# Patient Record
Sex: Female | Born: 1995
Health system: Southern US, Community
[De-identification: ages and names within clinical notes are randomized; demographics above are authoritative.]

## PROBLEM LIST (undated history)

## (undated) DIAGNOSIS — K219 Gastro-esophageal reflux disease without esophagitis: Secondary | ICD-10-CM

## (undated) DIAGNOSIS — M419 Scoliosis, unspecified: Secondary | ICD-10-CM

## (undated) HISTORY — DX: Gastro-esophageal reflux disease without esophagitis: K21.9

## (undated) HISTORY — PX: MANDIBLE SURGERY: SHX707

## (undated) HISTORY — DX: Scoliosis, unspecified: M41.9

---
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
## 2017-01-06 ENCOUNTER — Telehealth: Payer: Self-pay | Admitting: Internal Medicine

## 2017-01-06 NOTE — Telephone Encounter (Signed)
Pts mother is calling stating that she is in need of documentation stating that the pt was Dx w/ADHD and when/who Dx pt.  This is for college purpose due to her swimming and they do random drug testing and she wants to have documentation so that the pt will be legal for taking the Rx VYVANSE.  Pts mother would like to have a call back.

## 2017-01-07 ENCOUNTER — Encounter: Payer: Self-pay | Admitting: Emergency Medicine

## 2017-01-07 ENCOUNTER — Telehealth: Payer: Self-pay | Admitting: Emergency Medicine

## 2017-01-07 NOTE — Telephone Encounter (Signed)
Pts mother is calling back with the fax # [phone] [facility] [employer] [facility])

## 2017-01-07 NOTE — Telephone Encounter (Signed)
Would you like for me to write a letter? Please advise.

## 2017-01-07 NOTE — Telephone Encounter (Signed)
Letter has been faxed to the number and ATTN that was provided

## 2017-01-07 NOTE — Telephone Encounter (Signed)
Spoke with patient mother regarding letter being ready for pick up. Patient mother states that patient would like the letter faxed and will call back with the fax number.

## 2017-01-07 NOTE — Telephone Encounter (Signed)
Patient's mother arrived in clinic and would like to update the fax information.  The fax number is still [phone] but would like it to be ATTN: Nicholes Rough.

## 2017-01-07 NOTE — Telephone Encounter (Signed)
Left a VM for patient to give the office a call back.  

## 2017-01-07 NOTE — Telephone Encounter (Signed)
Ok to send letter that says   Dr  Lurline Hare psychologist  evaluated patient and advised trial of medication as she met the criteria for ADD  . As her PCP  In November 2016  We made a formal dx of ADD and began her on a Trial medication .   She is currently taking vyvanse 30 mg per day that has been helpful for her.     FYI I dont see dr Georgina Snell report in the EHR   If needed perhaps mom has that or if needed have to get from him . thanks

## 2017-02-06 ENCOUNTER — Encounter: Payer: Self-pay | Admitting: Internal Medicine

## 2017-03-06 DIAGNOSIS — R05 Cough: Secondary | ICD-10-CM | POA: Diagnosis not present

## 2017-03-06 DIAGNOSIS — J189 Pneumonia, unspecified organism: Secondary | ICD-10-CM | POA: Diagnosis not present

## 2017-03-06 DIAGNOSIS — R509 Fever, unspecified: Secondary | ICD-10-CM | POA: Diagnosis not present

## 2017-04-08 DIAGNOSIS — Z01419 Encounter for gynecological examination (general) (routine) without abnormal findings: Secondary | ICD-10-CM | POA: Diagnosis not present

## 2017-04-08 DIAGNOSIS — Z6822 Body mass index (BMI) 22.0-22.9, adult: Secondary | ICD-10-CM | POA: Diagnosis not present

## 2017-04-17 DIAGNOSIS — M25511 Pain in right shoulder: Secondary | ICD-10-CM | POA: Diagnosis not present

## 2017-04-28 DIAGNOSIS — J019 Acute sinusitis, unspecified: Secondary | ICD-10-CM | POA: Diagnosis not present

## 2017-04-28 DIAGNOSIS — J029 Acute pharyngitis, unspecified: Secondary | ICD-10-CM | POA: Diagnosis not present

## 2017-04-28 DIAGNOSIS — R05 Cough: Secondary | ICD-10-CM | POA: Diagnosis not present

## 2017-05-04 ENCOUNTER — Encounter: Payer: Self-pay | Admitting: Family Medicine

## 2017-05-04 ENCOUNTER — Ambulatory Visit (INDEPENDENT_AMBULATORY_CARE_PROVIDER_SITE_OTHER): Payer: BLUE CROSS/BLUE SHIELD | Admitting: Family Medicine

## 2017-05-04 VITALS — BP 108/62 | HR 90 | Temp 98.5°F | Ht 68.02 in | Wt 149.3 lb

## 2017-05-04 DIAGNOSIS — J988 Other specified respiratory disorders: Secondary | ICD-10-CM

## 2017-05-04 NOTE — Patient Instructions (Signed)

## 2017-05-04 NOTE — Progress Notes (Signed)
HPI:  Acute visit for respiratory illness: -started:5-6 days ago -symptoms:nasal congestion, sore throat, pnd, mild cough -denies:fever, SOB, NVD, tooth pain, body aches -[facility], strep negative, given cefidinir for "sinuses"  -sick contacts/travel/risks: no reported flu, strep or tick exposure -Hx of: sinus infection, pneumonia  ROS: See pertinent positives and negatives per HPI.  Past Medical History:  Diagnosis Date  . GERD (gastroesophageal reflux disease)    infant  . Premature baby    35 weeks   . Scoliosis     Past Surgical History:  Procedure Laterality Date  . MANDIBLE SURGERY      History reviewed. No pertinent family history.  Social History   Socioeconomic History  . Marital status: Single    Spouse name: None  . Number of children: None  . Years of education: None  . Highest education level: None  Social Needs  . Financial resource strain: None  . Food insecurity - worry: None  . Food insecurity - inability: None  . Transportation needs - medical: None  . Transportation needs - non-medical: None  Occupational History  . None  Tobacco Use  . Smoking status: Never Smoker  . Smokeless tobacco: Never Used  Substance and Sexual Activity  . Alcohol use: No    Alcohol/week: 0.0 oz  . Drug use: No  . Sexual activity: None  Other Topics Concern  . None  Social History Narrative   No illness in family   Neg ets   HH of  5    2 dog s   8th grade   [employer].    Swimming.  [employer]   [employer]     Current Outpatient Medications:  Marland Kitchen.  GILDESS FE 1/20 1-20 MG-MCG tablet, , Disp: , Rfl:  .  lisdexamfetamine (VYVANSE) 30 MG capsule, Take 1 capsule (30 mg total) by mouth daily. Fill  Third last ., Disp: 30 capsule, Rfl: 0  EXAM:  Vitals:   05/04/17 1257  BP: 108/62  Pulse: 90  Temp: 98.5 F (36.9 C)  SpO2: 98%    Body mass index is 22.69 kg/m.  GENERAL: vitals reviewed and listed above, alert, oriented,  appears well hydrated and in no acute distress  HEENT: atraumatic, conjunttiva clear, no obvious abnormalities on inspection of external nose and ears, normal appearance of ear canals and TMs, clear nasal congestion, mild post oropharyngeal erythema with PND, no tonsillar edema or exudate, no sinus TTP  NECK: no obvious masses on inspection  LUNGS: clear to auscultation bilaterally, no wheezes, rales or rhonchi, good air movement  CV: HRRR, no peripheral edema  MS: moves all extremities without noticeable abnormality  PSYCH: pleasant and cooperative, no obvious depression or anxiety  ASSESSMENT AND PLAN:  Discussed the following assessment and plan:  Respiratory infection  -given HPI and exam findings today, a serious infection or illness is unlikely. We discussed potential etiologies, with VURI being most likely, and advised supportive care and monitoring. We discussed treatment side effects, likely course, antibiotic misuse, transmission, and signs of developing a serious illness.  -of course, we advised to return or notify a doctor immediately if symptoms worsen or new concerns arise. Also advised follow up if not improving over the next 5-7 days.    Patient Instructions  INSTRUCTIONS FOR UPPER RESPIRATORY INFECTION:  -plenty of rest and fluids  -nasal saline wash 2-3 times daily (use prepackaged nasal saline or bottled/distilled water if making your own)   -can use AFRIN  nasal spray for drainage and nasal congestion - but do NOT use longer then 3-4 days  -can use tylenol (in no history of liver disease) or ibuprofen (if no history of kidney disease, bowel bleeding or significant heart disease) as directed for aches and sorethroat  -in the winter time, using a humidifier at night is helpful (please follow cleaning instructions)  -if you are taking a cough medication - use only as directed, may also try a teaspoon of honey to coat the throat and throat lozenges. If given a  cough medication with codeine or hydrocodone or other narcotic please be advised that this contains a strong and  potentially addicting medication. Please follow instructions carefully, take as little as possible and only use AS NEEDED for severe cough. Discuss potential side effects with your pharmacy. Please do not drive or operate machinery while taking these types of medications. Please do not take other sedating medications, drugs or alcohol while taking this medication without discussing with your doctor.  -for sore throat, salt water gargles can help  -follow up if you have fevers, facial pain, tooth pain, difficulty breathing or are worsening or symptoms persist longer then expected  Upper Respiratory Infection, Adult An upper respiratory infection (URI) is also known as the common cold. It is often caused by a type of germ (virus). Colds are easily spread (contagious). You can pass it to others by kissing, coughing, sneezing, or drinking out of the same glass. Usually, you get better in 1 to 3  weeks.  However, the cough can last for even longer. HOME CARE   Only take medicine as told by your doctor. Follow instructions provided above.  Drink enough water and fluids to keep your pee (urine) clear or pale yellow.  Get plenty of rest.  Return to work when your temperature is < 100 for 24 hours or as told by your doctor. You may use a face mask and wash your hands to stop your cold from spreading. GET HELP RIGHT AWAY IF:   After the first few days, you feel you are getting worse.  You have questions about your medicine.  You have chills, shortness of breath, or red spit (mucus).  You have pain in the face for more then 1-2 days, especially when you bend forward.  You have a fever, puffy (swollen) neck, pain when you swallow, or white spots in the back of your throat.  You have a bad headache, ear pain, sinus pain, or chest pain.  You have a high-pitched whistling sound when you  breathe in and out (wheezing).  You cough up blood.  You have sore muscles or a stiff neck. MAKE SURE YOU:   Understand these instructions.  Will watch your condition.  Will get help right away if you are not doing well or get worse. Document Released: 10/22/2007 Document Revised: 07/28/2011 Document Reviewed: 08/10/2013 [facility] Patient Information 2015 [employer] [place] [place]. This information is not intended to replace advice given to you by your health care provider. Make sure you discuss any questions you have with your health care provider.    Kriste BasqueKIM, Bianney Rockwood R., DO

## 2017-05-21 DIAGNOSIS — M13811 Other specified arthritis, right shoulder: Secondary | ICD-10-CM | POA: Diagnosis not present

## 2017-05-21 DIAGNOSIS — M7541 Impingement syndrome of right shoulder: Secondary | ICD-10-CM | POA: Diagnosis not present

## 2017-06-03 DIAGNOSIS — J039 Acute tonsillitis, unspecified: Secondary | ICD-10-CM | POA: Diagnosis not present

## 2017-06-03 DIAGNOSIS — R5381 Other malaise: Secondary | ICD-10-CM | POA: Diagnosis not present

## 2017-07-17 DIAGNOSIS — M19011 Primary osteoarthritis, right shoulder: Secondary | ICD-10-CM | POA: Diagnosis not present

## 2017-07-17 DIAGNOSIS — G8918 Other acute postprocedural pain: Secondary | ICD-10-CM | POA: Diagnosis not present

## 2017-07-17 DIAGNOSIS — R600 Localized edema: Secondary | ICD-10-CM | POA: Diagnosis not present

## 2017-07-17 DIAGNOSIS — M7541 Impingement syndrome of right shoulder: Secondary | ICD-10-CM | POA: Diagnosis not present

## 2017-07-17 DIAGNOSIS — Z4889 Encounter for other specified surgical aftercare: Secondary | ICD-10-CM | POA: Diagnosis not present

## 2017-07-24 DIAGNOSIS — Z4789 Encounter for other orthopedic aftercare: Secondary | ICD-10-CM | POA: Diagnosis not present

## 2017-08-13 ENCOUNTER — Other Ambulatory Visit: Payer: Self-pay | Admitting: Internal Medicine

## 2017-08-13 NOTE — Telephone Encounter (Signed)
After filling out all information pts mother changed mind on which pharmacy she wants this sent to,  She is calling back with different pharmacy location

## 2017-08-13 NOTE — Telephone Encounter (Signed)
[facility] [place] Ph # [phone]. Please call once sent, [phone]

## 2017-08-13 NOTE — Telephone Encounter (Signed)
Copied from CRM [phone]. Topic: Quick Communication - Rx Refill/Question >> Aug 13, 2017 12:31 PM Eston Mouldavis, Doran Nestle B wrote: Medication: lisdexamfetamine (VYVANSE) 30 MG capsule  Has the patient contacted their pharmacy? Yes no refills  (Agent: If no, request that the patient contact the pharmacy for the refill.)  Preferred Pharmacy (with phone number or street name): [facility] [place] - [address] [phone] (Phone) [phone] (Fax)      Agent: Please be advised that RX refills may take up to 3 business days. We ask that you follow-up with your pharmacy.

## 2017-08-14 MED ORDER — LISDEXAMFETAMINE DIMESYLATE 30 MG PO CAPS: 30.0000 mg | ORAL_CAPSULE | Freq: Every day | ORAL | 0 refills | Status: DC

## 2017-08-14 NOTE — Telephone Encounter (Signed)
She is due for an ov med check with me  Last seem by me   July 18   But will send in refill  Today  Have her    Get on the appt schedule

## 2017-08-14 NOTE — Telephone Encounter (Signed)
Last filled 09/08/17, #30 Last OV 05/04/17  Please advise Dr Fabian SharpPanosh, thanks.

## 2017-08-17 NOTE — Telephone Encounter (Signed)
Pt notified via mychart Nothing further needed.  

## 2017-08-23 DIAGNOSIS — J029 Acute pharyngitis, unspecified: Secondary | ICD-10-CM | POA: Diagnosis not present

## 2017-08-23 DIAGNOSIS — J00 Acute nasopharyngitis [common cold]: Secondary | ICD-10-CM | POA: Diagnosis not present

## 2017-08-25 DIAGNOSIS — R05 Cough: Secondary | ICD-10-CM | POA: Diagnosis not present

## 2017-08-25 DIAGNOSIS — R0789 Other chest pain: Secondary | ICD-10-CM | POA: Diagnosis not present

## 2017-08-25 DIAGNOSIS — R0781 Pleurodynia: Secondary | ICD-10-CM | POA: Diagnosis not present

## 2017-08-25 DIAGNOSIS — R0602 Shortness of breath: Secondary | ICD-10-CM | POA: Diagnosis not present

## 2017-09-09 DIAGNOSIS — N390 Urinary tract infection, site not specified: Secondary | ICD-10-CM | POA: Diagnosis not present

## 2017-09-09 DIAGNOSIS — N898 Other specified noninflammatory disorders of vagina: Secondary | ICD-10-CM | POA: Diagnosis not present

## 2017-09-24 DIAGNOSIS — Z3009 Encounter for other general counseling and advice on contraception: Secondary | ICD-10-CM | POA: Diagnosis not present

## 2017-09-24 DIAGNOSIS — Z3202 Encounter for pregnancy test, result negative: Secondary | ICD-10-CM | POA: Diagnosis not present

## 2017-11-02 NOTE — Progress Notes (Signed)
Chief Complaint  Patient presents with  . Medication Management    HPI: Ann Cruz 22 y.o. come in for med  management  Last  seen  July 2018  By me   Last refill march 19   Not using med   Every single day.   Except exam time.  But helps a good bit  For focusing  Since last visit : Taking medication: better with work.  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  Sleep : 8  Diet : ok Mood : ok ( no se like with adderall) Medication assessment :No significant side effects such as major sleep issues and mood changes, chest pain, shortness of breath, headaches , GI or significant weight loss. Neg tad ex ocass etoh   transferring from [employer] to [employer]   Had injury this year with her swimming   Has hx of warts on fingers  And was rx with hot or something in past and helped but coming back small areas on each index finger .   no pain   ROS: See pertinent positives and negatives per HPI. No cp pulm sx noted   Past Medical History:  Diagnosis Date  . GERD (gastroesophageal reflux disease)    infant  . Premature baby    35 weeks   . Scoliosis     No family history on file.  Social History   Socioeconomic History  . Marital status: Single    Spouse name: Not on file  . Number of children: Not on file  . Years of education: Not on file  . Highest education level: Not on file  Occupational History  . Not on file  Social Needs  . Financial resource strain: Not on file  . Food insecurity:    Worry: Not on file    Inability: Not on file  . Transportation needs:    Medical: Not on file    Non-medical: Not on file  Tobacco Use  . Smoking status: Never Smoker  . Smokeless tobacco: Never Used  Substance and Sexual Activity  . Alcohol use: No    Alcohol/week: 0.0 oz  . Drug use: No  . Sexual activity: Not on file  Lifestyle  . Physical activity:    Days per week: Not on file    Minutes per  session: Not on file  . Stress: Not on file  Relationships  . Social connections:    Talks on phone: Not on file    Gets together: Not on file    Attends religious service: Not on file    Active member of club or organization: Not on file    Attends meetings of clubs or organizations: Not on file    Relationship status: Not on file  Other Topics Concern  . Not on file  Social History Narrative   No illness in family   Neg ets   HH of  5    2 dog s   8th grade   [employer].    Swimming.  [employer]   [employer]    Outpatient Medications Prior to Visit  Medication Sig Dispense Refill  . GILDESS FE 1/20 1-20 MG-MCG tablet Take 1 tablet by mouth daily.     Marland Kitchen. lisdexamfetamine (VYVANSE) 30 MG capsule Take 1 capsule (30 mg total) by mouth daily. Needs office visit before further refills (Patient taking differently: Take 30  mg by mouth daily. ) 30 capsule 0   No facility-administered medications prior to visit.      EXAM:  BP 125/73   Pulse 85   Temp 99.3 F (37.4 C)   Wt 151 lb (68.5 kg)   BMI 22.95 kg/m   Body mass index is 22.95 kg/m.  GENERAL: vitals reviewed and listed above, alert, oriented, appears well hydrated and in no acute distress HEENT: atraumatic, conjunctiva  clear, no obvious abnormalities on inspection of external nose and ears NECK: no obvious masses on inspection palpation  LUNGS: clear to auscultation bilaterally, no wheezes, rales or rhonchi, good air movement CV: HRRR, no clubbing cyanosis or  peripheral edema nl cap refill  Abdomen:  Sof,t normal bowel sounds without hepatosplenomegaly, no guarding rebound or masses no CVA tenderness MS: moves all extremities without noticeable focal  Abnormality Skin 1+ mm wart on left index finger and even smaller are on left      PSYCH: pleasant and cooperative, no obvious depression or anxiety Lab Results  Component Value Date   WBC 6.4 12/24/2016   HGB 15.1 12/24/2016   HCT 44.6 12/24/2016   PLT 286.0  12/24/2016   GLUCOSE 91 12/24/2016   CHOL 139 12/24/2016   TRIG 150.0 (H) 12/24/2016   HDL 55.40 12/24/2016   LDLCALC 53 12/24/2016   NA 136 12/24/2016   K 4.2 12/24/2016   CL 104 12/24/2016   CREATININE 0.97 12/24/2016   BUN 16 12/24/2016   CO2 28 12/24/2016   BP Readings from Last 3 Encounters:  11/03/17 125/73  05/04/17 108/62  11/25/16 104/70    ASSESSMENT AND PLAN:  Discussed the following assessment and plan:  Attention deficit hyperactivity disorder (ADHD), unspecified ADHD type  Medication management  Viral wart on finger - see text  Option to use  freez liq n but so small may wasn't ot use otc for a herw weeks  And if growing then  Re rx ( on a senistive area of   Distal finger pads  -Patient advised to return or notify health care team  if  new concerns arise.  Patient Instructions  Ok to remain on same medication    May want to try otc topical over night for  3-4 weeks and then as needed. If  Getting larger can come in for freezing rx if wishe.   PLan  ROV in 6 months .  Or as needed .     Neta Mends. Lily Kernen M.D.

## 2017-11-03 ENCOUNTER — Ambulatory Visit (INDEPENDENT_AMBULATORY_CARE_PROVIDER_SITE_OTHER): Payer: BLUE CROSS/BLUE SHIELD | Admitting: Internal Medicine

## 2017-11-03 ENCOUNTER — Encounter: Payer: Self-pay | Admitting: Internal Medicine

## 2017-11-03 VITALS — BP 125/73 | HR 85 | Temp 99.3°F | Wt 151.0 lb

## 2017-11-03 DIAGNOSIS — F909 Attention-deficit hyperactivity disorder, unspecified type: Secondary | ICD-10-CM

## 2017-11-03 DIAGNOSIS — B079 Viral wart, unspecified: Secondary | ICD-10-CM

## 2017-11-03 DIAGNOSIS — Z79899 Other long term (current) drug therapy: Secondary | ICD-10-CM

## 2017-11-03 MED ORDER — LISDEXAMFETAMINE DIMESYLATE 30 MG PO CAPS: 30.0000 mg | ORAL_CAPSULE | Freq: Every day | ORAL | 0 refills | Status: DC

## 2017-11-03 NOTE — Patient Instructions (Signed)
Ok to remain on same medication    May want to try otc topical over night for  3-4 weeks and then as needed. If  Getting larger can come in for freezing rx if wishe.   PLan  ROV in 6 months .  Or as needed .

## 2018-03-24 DIAGNOSIS — F909 Attention-deficit hyperactivity disorder, unspecified type: Secondary | ICD-10-CM | POA: Diagnosis not present

## 2018-03-31 DIAGNOSIS — F909 Attention-deficit hyperactivity disorder, unspecified type: Secondary | ICD-10-CM | POA: Diagnosis not present

## 2018-04-07 DIAGNOSIS — F909 Attention-deficit hyperactivity disorder, unspecified type: Secondary | ICD-10-CM | POA: Diagnosis not present

## 2018-04-21 DIAGNOSIS — F909 Attention-deficit hyperactivity disorder, unspecified type: Secondary | ICD-10-CM | POA: Diagnosis not present

## 2018-05-03 DIAGNOSIS — Z01419 Encounter for gynecological examination (general) (routine) without abnormal findings: Secondary | ICD-10-CM | POA: Diagnosis not present

## 2018-05-03 DIAGNOSIS — Z113 Encounter for screening for infections with a predominantly sexual mode of transmission: Secondary | ICD-10-CM | POA: Diagnosis not present

## 2018-05-03 DIAGNOSIS — N760 Acute vaginitis: Secondary | ICD-10-CM | POA: Diagnosis not present

## 2018-05-03 DIAGNOSIS — Z6822 Body mass index (BMI) 22.0-22.9, adult: Secondary | ICD-10-CM | POA: Diagnosis not present

## 2018-06-02 ENCOUNTER — Telehealth: Payer: Self-pay | Admitting: Internal Medicine

## 2018-06-02 NOTE — Telephone Encounter (Signed)
Patient has an appointment scheduled for 06/18/2018.

## 2018-06-02 NOTE — Telephone Encounter (Signed)
Medication not delegated for NT to refill. 

## 2018-06-02 NOTE — Telephone Encounter (Signed)
Copied from CRM [id number]. Topic: Quick Communication - Rx Refill/Question >> Jun 02, 2018  1:08 PM Mickel Baas B, [place] wrote: **Patient calling and made an appointment for medication refill. States that she only has 4 pills left. Her appointment is 06/18/2018. Please advise. **   Medication: lisdexamfetamine (VYVANSE) 30 MG capsule  Has the patient contacted their pharmacy? Yes.  To call office (Agent: If no, request that the patient contact the pharmacy for the refill.) (Agent: If yes, when and what did the pharmacy advise?)  Preferred Pharmacy (with phone number or street name[id number] [place], [place] - [address]  Agent: Please be advised that RX refills may take up to 3 business days. We ask that you follow-up with your pharmacy.

## 2018-06-02 NOTE — Telephone Encounter (Signed)
She is over due for ROV   Get her appt ( for when she is back in town and then I will refill medication.

## 2018-06-03 MED ORDER — LISDEXAMFETAMINE DIMESYLATE 30 MG PO CAPS: 30.0000 mg | ORAL_CAPSULE | Freq: Every day | ORAL | 0 refills | Status: DC

## 2018-06-03 NOTE — Addendum Note (Signed)
Addended byBerniece Andreas K on: 06/03/2018 12:14 PM   Modules accepted: Orders

## 2018-06-03 NOTE — Telephone Encounter (Signed)
Sent in electronically .  

## 2018-06-08 MED ORDER — LISDEXAMFETAMINE DIMESYLATE 30 MG PO CAPS: 30.0000 mg | ORAL_CAPSULE | Freq: Every day | ORAL | 0 refills | Status: DC

## 2018-06-08 NOTE — Telephone Encounter (Addendum)
Please cancel the  One sent ot [place]     Message to me didn't  Give the corect pharmacy   Sent in electronically . To updated pharmacy

## 2018-06-08 NOTE — Telephone Encounter (Signed)
Please advise Dr Panosh, thanks.   

## 2018-06-08 NOTE — Addendum Note (Signed)
Addended byMadelin Headings on: 06/08/2018 07:22 PM   Modules accepted: Orders

## 2018-06-08 NOTE — Telephone Encounter (Signed)
Pt states that she is in school at [employer] now and needs RX sent to correct pharmacy.  [id number] [place] Ho[place], [place] - [address] AT [address] [phone] (Phone) [phone] (Fax)   Please advise when sent in.

## 2018-06-09 NOTE — Telephone Encounter (Signed)
[facility]. Nothing further needed.

## 2018-06-16 DIAGNOSIS — Z20828 Contact with and (suspected) exposure to other viral communicable diseases: Secondary | ICD-10-CM | POA: Diagnosis not present

## 2018-06-16 DIAGNOSIS — R05 Cough: Secondary | ICD-10-CM | POA: Diagnosis not present

## 2018-06-17 NOTE — Progress Notes (Signed)
Chief Complaint  Patient presents with  . Follow-up    Pt is here for med refill of vyvanse     HPI: Ann Cruz 23 y.o. come in for med management  Taking most days and helps a lot  Trying to  Do intermal logs.    No significant side effects such as major sleep issues and mood changes, chest pain, shortness of breath, headaches , GI or significant weight loss. Had flu in past week .   Fever x 3   Took tamiflu little cough and just  congestion . ocass etoh .  Last semester 3.8 not sure yet.  upt today  No TRD      2 -3 time per week  Around 8 am .  ROS: See pertinent positives and negatives per HPI. No feer sob   Past Medical History:  Diagnosis Date  . GERD (gastroesophageal reflux disease)    infant  . Premature baby    35 weeks   . Scoliosis     No family history on file.  Social History   Socioeconomic History  . Marital status: Single    Spouse name: Not on file  . Number of children: Not on file  . Years of education: Not on file  . Highest education level: Not on file  Occupational History  . Not on file  Social Needs  . Financial resource strain: Not on file  . Food insecurity:    Worry: Not on file    Inability: Not on file  . Transportation needs:    Medical: Not on file    Non-medical: Not on file  Tobacco Use  . Smoking status: Never Smoker  . Smokeless tobacco: Never Used  Substance and Sexual Activity  . Alcohol use: No    Alcohol/week: 0.0 standard drinks  . Drug use: No  . Sexual activity: Not on file  Lifestyle  . Physical activity:    Days per week: Not on file    Minutes per session: Not on file  . Stress: Not on file  Relationships  . Social connections:    Talks on phone: Not on file    Gets together: Not on file    Attends religious service: Not on file    Active member of club or organization: Not on file    Attends meetings of clubs or organizations: Not on file    Relationship status: Not on file  Other Topics  Concern  . Not on file  Social History Narrative   No illness in family   Neg ets   HH of  5    2 dog s   8th grade   Agricultural engineer.    Swimming.  [employer]    Outpatient Medications Prior to Visit  Medication Sig Dispense Refill  . GILDESS FE 1/20 1-20 MG-MCG tablet Take 1 tablet by mouth daily.     Marland Kitchen oseltamivir (TAMIFLU) 75 MG capsule TK 1 C PO BID    . lisdexamfetamine (VYVANSE) 30 MG capsule Take 1 capsule (30 mg total) by mouth daily. 30 capsule 0   No facility-administered medications prior to visit.      EXAM:  BP 118/66 (BP Location: Right Arm, Patient Position: Sitting, Cuff Size: Normal)   Pulse 80   Temp 98.2 F (36.8 C) (Oral)   Ht 5\' 8"  (1.727 m)   Wt 150 lb 11.2 oz (68.4 kg)   LMP 05/24/2018   BMI 22.91 kg/m  Body mass index is 22.91 kg/m.  GENERAL: vitals reviewed and listed above, alert, oriented, appears well hydrated and in no acute distress congested but well HEENT: atraumatic, conjunctiva  clear, no obvious abnormalities on inspection of external nose and ears tms  Clear  OP : no lesion edema or exudate  NECK: no obvious masses on inspection palpation  LUNGS: clear to auscultation bilaterally, no wheezes, rales or rhonchi, good air movement CV: HRRR, no clubbing cyanosis or  peripheral edema nl cap refill  Abdomen:  Sof,t normal bowel sounds without hepatosplenomegaly, no guarding rebound or masses no CVA tenderness MS: moves all extremities without noticeable focal  abnormality PSYCH: pleasant and cooperative, no obvious depression or anxiety Lab Results  Component Value Date   WBC 6.4 12/24/2016   HGB 15.1 12/24/2016   HCT 44.6 12/24/2016   PLT 286.0 12/24/2016   GLUCOSE 91 12/24/2016   CHOL 139 12/24/2016   TRIG 150.0 (H) 12/24/2016   HDL 55.40 12/24/2016   LDLCALC 53 12/24/2016   NA 136 12/24/2016   K 4.2 12/24/2016   CL 104 12/24/2016   CREATININE 0.97 12/24/2016   BUN 16 12/24/2016   CO2 28 12/24/2016   BP Readings  from Last 3 Encounters:  06/18/18 118/66  11/03/17 125/73  05/04/17 108/62    ASSESSMENT AND PLAN:  Discussed the following assessment and plan:  Attention deficit hyperactivity disorder (ADHD), unspecified ADHD type  Medication management  Acute respiratory infection - recovering flu like under care  no alarm  findings  .exp managtment  Refill med today  -Patient advised to return or notify health care team  if  new concerns arise.  Patient Instructions  Medication  Refilled .  ROV  In 6 months   Med check or  CPX   Skyelar Halliday K. Nakira Litzau M.D.

## 2018-06-18 ENCOUNTER — Ambulatory Visit (INDEPENDENT_AMBULATORY_CARE_PROVIDER_SITE_OTHER): Payer: BLUE CROSS/BLUE SHIELD | Admitting: Internal Medicine

## 2018-06-18 ENCOUNTER — Encounter: Payer: Self-pay | Admitting: Internal Medicine

## 2018-06-18 VITALS — BP 118/66 | HR 80 | Temp 98.2°F | Ht 68.0 in | Wt 150.7 lb

## 2018-06-18 DIAGNOSIS — J22 Unspecified acute lower respiratory infection: Secondary | ICD-10-CM

## 2018-06-18 DIAGNOSIS — Z79899 Other long term (current) drug therapy: Secondary | ICD-10-CM | POA: Diagnosis not present

## 2018-06-18 DIAGNOSIS — F909 Attention-deficit hyperactivity disorder, unspecified type: Secondary | ICD-10-CM | POA: Diagnosis not present

## 2018-06-18 MED ORDER — LISDEXAMFETAMINE DIMESYLATE 30 MG PO CAPS: 30.0000 mg | ORAL_CAPSULE | Freq: Every day | ORAL | 0 refills | Status: DC

## 2018-06-18 NOTE — Patient Instructions (Signed)
Medication  Refilled .  ROV  In 6 months   Med check or  CPX

## 2018-08-04 DIAGNOSIS — B078 Other viral warts: Secondary | ICD-10-CM | POA: Diagnosis not present

## 2018-11-05 DIAGNOSIS — Z20828 Contact with and (suspected) exposure to other viral communicable diseases: Secondary | ICD-10-CM | POA: Diagnosis not present

## 2019-02-07 DIAGNOSIS — F429 Obsessive-compulsive disorder, unspecified: Secondary | ICD-10-CM | POA: Diagnosis not present

## 2019-02-07 DIAGNOSIS — F331 Major depressive disorder, recurrent, moderate: Secondary | ICD-10-CM | POA: Diagnosis not present

## 2019-02-15 DIAGNOSIS — F429 Obsessive-compulsive disorder, unspecified: Secondary | ICD-10-CM | POA: Diagnosis not present

## 2019-02-15 DIAGNOSIS — F411 Generalized anxiety disorder: Secondary | ICD-10-CM | POA: Diagnosis not present

## 2019-03-08 DIAGNOSIS — F411 Generalized anxiety disorder: Secondary | ICD-10-CM | POA: Diagnosis not present

## 2019-03-08 DIAGNOSIS — F429 Obsessive-compulsive disorder, unspecified: Secondary | ICD-10-CM | POA: Diagnosis not present

## 2019-03-13 DIAGNOSIS — N390 Urinary tract infection, site not specified: Secondary | ICD-10-CM | POA: Diagnosis not present

## 2019-03-17 DIAGNOSIS — F411 Generalized anxiety disorder: Secondary | ICD-10-CM | POA: Diagnosis not present

## 2019-03-28 DIAGNOSIS — F411 Generalized anxiety disorder: Secondary | ICD-10-CM | POA: Diagnosis not present

## 2019-03-28 DIAGNOSIS — F429 Obsessive-compulsive disorder, unspecified: Secondary | ICD-10-CM | POA: Diagnosis not present

## 2019-04-19 ENCOUNTER — Telehealth: Payer: BLUE CROSS/BLUE SHIELD | Admitting: Internal Medicine

## 2019-04-19 NOTE — Progress Notes (Deleted)
Virtual Visit via Video Note  I connected with@ on 04/19/19 at  3:30 PM EST by a video enabled telemedicine application and verified that I am speaking with the correct person using two identifiers. Location patient: home Location provider:work or home office Persons participating in the virtual visit: patient, provider  [place] national recommendations  regarding COVID 19 pandemic   video visit is advised over in office visit for this patient.  Patient aware  of the limitations of evaluation and management by telemedicine and  availability of in person appointments. and agreed to proceed.   HPI: Ann Cruz presents for video visit  Overdue for med check adhd   Last seen Jan 2020 No significant side effects such as major sleep issues and mood changes, chest pain, shortness of breath, headaches , GI or significant weight loss.  Hx of secondary anxiety stable     ROS: See pertinent positives and negatives per HPI.  Past Medical History:  Diagnosis Date  . GERD (gastroesophageal reflux disease)    infant  . Premature baby    35 weeks   . Scoliosis     Past Surgical History:  Procedure Laterality Date  . MANDIBLE SURGERY      No family history on file.  Social History   Tobacco Use  . Smoking status: Never Smoker  . Smokeless tobacco: Never Used  Substance Use Topics  . Alcohol use: No    Alcohol/week: 0.0 standard drinks  . Drug use: No      Current Outpatient Medications:  Marland Kitchen  GILDESS FE 1/20 1-20 MG-MCG tablet, Take 1 tablet by mouth daily. , Disp: , Rfl:  .  lisdexamfetamine (VYVANSE) 30 MG capsule, Take 1 capsule (30 mg total) by mouth daily., Disp: 30 capsule, Rfl: 0 .  oseltamivir (TAMIFLU) 75 MG capsule, TK 1 C PO BID, Disp: , Rfl:   EXAM: BP Readings from Last 3 Encounters:  06/18/18 118/66  11/03/17 125/73  05/04/17 108/62    VITALS per patient if applicable:  GENERAL: alert, oriented, appears well and in no acute distress  HEENT:  atraumatic, conjunttiva clear, no obvious abnormalities on inspection of external nose and ears  NECK: normal movements of the head and neck  LUNGS: on inspection no signs of respiratory distress, breathing rate appears normal, no obvious gross SOB, gasping or wheezing  CV: no obvious cyanosis  MS: moves all visible extremities without noticeable abnormality  PSYCH/NEURO: pleasant and cooperative, no obvious depression or anxiety, speech and thought processing grossly intact Lab Results  Component Value Date   WBC 6.4 12/24/2016   HGB 15.1 12/24/2016   HCT 44.6 12/24/2016   PLT 286.0 12/24/2016   GLUCOSE 91 12/24/2016   CHOL 139 12/24/2016   TRIG 150.0 (H) 12/24/2016   HDL 55.40 12/24/2016   LDLCALC 53 12/24/2016   NA 136 12/24/2016   K 4.2 12/24/2016   CL 104 12/24/2016   CREATININE 0.97 12/24/2016   BUN 16 12/24/2016   CO2 28 12/24/2016    ASSESSMENT AND PLAN:  Discussed the following assessment and plan:    ICD-10-CM   1. Attention deficit hyperactivity disorder (ADHD), unspecified ADHD type  F90.9   2. Medication management  Z79.899    ? Pap and flu inj Counseled.   Expectant management and discussion of plan and treatment with opportunity to ask questions and all were answered. The patient agreed with the plan and demonstrated an understanding of the instructions.   Advised to call back or seek  an in-person evaluation if worsening  or having  further concerns . No follow-ups on file. I provided *** minutes of non-face-to-face time during this encounter.   Berniece Andreas, MD

## 2019-05-05 DIAGNOSIS — Z01419 Encounter for gynecological examination (general) (routine) without abnormal findings: Secondary | ICD-10-CM | POA: Diagnosis not present

## 2019-05-05 DIAGNOSIS — N946 Dysmenorrhea, unspecified: Secondary | ICD-10-CM | POA: Diagnosis not present

## 2019-05-05 DIAGNOSIS — Z6823 Body mass index (BMI) 23.0-23.9, adult: Secondary | ICD-10-CM | POA: Diagnosis not present

## 2019-06-01 ENCOUNTER — Encounter: Payer: Self-pay | Admitting: Internal Medicine

## 2019-06-01 ENCOUNTER — Telehealth (INDEPENDENT_AMBULATORY_CARE_PROVIDER_SITE_OTHER): Payer: BLUE CROSS/BLUE SHIELD | Admitting: Internal Medicine

## 2019-06-01 ENCOUNTER — Other Ambulatory Visit: Payer: Self-pay

## 2019-06-01 DIAGNOSIS — Z79899 Other long term (current) drug therapy: Secondary | ICD-10-CM | POA: Diagnosis not present

## 2019-06-01 DIAGNOSIS — F909 Attention-deficit hyperactivity disorder, unspecified type: Secondary | ICD-10-CM

## 2019-06-01 MED ORDER — LISDEXAMFETAMINE DIMESYLATE 30 MG PO CAPS: 30.0000 mg | ORAL_CAPSULE | Freq: Every day | ORAL | 0 refills | Status: DC

## 2019-06-01 NOTE — Progress Notes (Signed)
Virtual Visit via Video Note  I connected with@ on 06/01/19 at  9:30 AM EST by a video enabled telemedicine application and verified that I am speaking with the correct person using two identifiers. Location patient: home Location provider:work office Persons participating in the virtual visit: patient, provider  [place] national recommendations  regarding COVID 19 pandemic   video visit is advised over in office visit for this patient.  Patient aware  of the limitations of evaluation and management by telemedicine and  availability of in person appointments. and agreed to proceed.   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 No major change in  heatlh status  Noted after going on line for studies  Some struggles getting organized and attentive and getting back on vyvanse able to organize and attend  Denies se of import . Not taking every day but mostly MOn - Fri.   Had GYNE check up  About 3 weeks ago and was normal  bp ok and  On ocps   Neg TD  Social etoh Sleep 8-9 hours  Exercise goes to gym and stays active.   ROS: See pertinent positives and negatives per HPI.  Past Medical History:  Diagnosis Date  . GERD (gastroesophageal reflux disease)    infant  . Premature baby    35 weeks   . Scoliosis     Past Surgical History:  Procedure Laterality Date  . MANDIBLE SURGERY      History reviewed. No pertinent family history.  Social History   Tobacco Use  . Smoking status: Never Smoker  . Smokeless tobacco: Never Used  Substance Use Topics  . Alcohol use: No    Alcohol/week: 0.0 standard drinks  . Drug use: No      Current Outpatient Medications:  Marland Kitchen  GILDESS FE 1/20 1-20 MG-MCG tablet, Take 1 tablet by mouth daily. , Disp: , Rfl:  .  lisdexamfetamine (VYVANSE) 30 MG capsule, Take 1  capsule (30 mg total) by mouth daily., Disp: 30 capsule, Rfl: 0  EXAM: BP Readings from Last 3 Encounters:  06/18/18 118/66  11/03/17 125/73  05/04/17 108/62    VITALS per patient if applicable:  GENERAL: alert, oriented, appears well and in no acute distress  HEENT: atraumatic, conjunttiva clear, no obvious abnormalities on inspection of external nose and ears  NECK: normal movements of the head and neck  LUNGS: on inspection no signs of respiratory distress, breathing rate appears normal, no obvious gross SOB, gasping or wheezing  CV: no obvious cyanosis  MS: moves all visible extremities without noticeable abnormality  PSYCH/NEURO: pleasant and cooperative, no obvious depression or anxiety, speech and thought processing grossly intact   ASSESSMENT AND PLAN:  Discussed the following assessment and plan:    ICD-10-CM   1. Attention deficit hyperactivity disorder (ADHD), unspecified ADHD type  F90.9   2. Medication management  Z79.899    Benefit more than risk of medication  to continue.   At this time   Counseled.   Ok to do 90 days if Physiological scientist .   Expectant management and discussion of plan and treatment with opportunity to ask questions and all were answered. The patient agreed with the plan and demonstrated an understanding of the  instructions.   Advised to call back or seek an in-person evaluation if worsening  or having  further concerns . Return in about 6 months (around 11/29/2019) for medication.    Shanon Ace, MD

## 2019-06-08 ENCOUNTER — Telehealth: Payer: Self-pay | Admitting: *Deleted

## 2019-06-08 NOTE — Telephone Encounter (Signed)
Copied from CRM [id number]. Topic: General - Inquiry >> Jun 08, 2019  9:42 AM Ann Cruz wrote: Reason for CRM: pt called and stated that the lisdexamfetamine (VYVANSE) 30 MG capsule [[id number]] is over 333.00 and can not pay for it.  She going to call [facility] and see which one they cover and call back  Best number [phone] >> Jun 08, 2019 10:24 AM Ann Cruz wrote: Pt stated that her insurance company recommended Dexedrine/ please advise

## 2019-06-08 NOTE — Telephone Encounter (Signed)
This has already been sent In a mychart message to Dr.Panosh

## 2019-06-09 MED ORDER — METHYLPHENIDATE HCL ER (OSM) 27 MG PO TBCR: 27.0000 mg | EXTENDED_RELEASE_TABLET | ORAL | 0 refills | Status: DC

## 2019-06-09 NOTE — Telephone Encounter (Signed)
I sent in concerta 27 to [facility] [address]. Let meknow how doing in a few weeks

## 2019-06-09 NOTE — Telephone Encounter (Signed)
So  Make sure you  Have checked or coupons on line from manufacturers   Wonder if you have a high decutable or not a formulary alternatove .  To hav such a high price   Sounds like not covered at all and usually a 200 $    Dexedrine   Not sure  If avaialabe in  ER form and would not recommend in  Immediate release form .     Consdier  A med like Focalin XR  ( methylphenidate derivative   ) or a ritalin based med such as  Generic concerta  metadate  Etc   Ask your pharmacist how much focalin XR 10 mg generic  Or concerta  27 mg  Generic  As available  Cost with your plan .   Let me know which you may want to try.

## 2019-06-17 DIAGNOSIS — N3090 Cystitis, unspecified without hematuria: Secondary | ICD-10-CM | POA: Diagnosis not present

## 2019-12-17 DIAGNOSIS — Z20822 Contact with and (suspected) exposure to covid-19: Secondary | ICD-10-CM | POA: Diagnosis not present

## 2019-12-18 DIAGNOSIS — J029 Acute pharyngitis, unspecified: Secondary | ICD-10-CM | POA: Diagnosis not present

## 2019-12-18 DIAGNOSIS — B349 Viral infection, unspecified: Secondary | ICD-10-CM | POA: Diagnosis not present

## 2020-01-02 ENCOUNTER — Telehealth: Payer: Self-pay | Admitting: Internal Medicine

## 2020-01-02 MED ORDER — METHYLPHENIDATE HCL ER (OSM) 27 MG PO TBCR: 27.0000 mg | EXTENDED_RELEASE_TABLET | ORAL | 0 refills | Status: DC

## 2020-01-02 NOTE — Telephone Encounter (Signed)
Needs to arrange  Visit med check  Can be virtual this time  Will refill x 1 this time  No further refill until   Visit  She will need in person visit for  Continued   Controlled substance  Management

## 2020-01-02 NOTE — Telephone Encounter (Signed)
Pt requesting a refill  methylphenidate (CONCERTA) 27 MG PO CR tablet  [id number] - [place], [place] - [address]

## 2020-01-02 NOTE — Telephone Encounter (Signed)
Last OV 06/01/2019  Last filled 06/09/2019, # 30 with 0 refills

## 2020-01-03 NOTE — Telephone Encounter (Signed)
Called patient and made a virtual appointment for her on 01/04/2020 at 12:30pm and will call her after 12pm to check her in. Patient verbalized an understanding.

## 2020-01-04 ENCOUNTER — Other Ambulatory Visit: Payer: Self-pay

## 2020-01-04 ENCOUNTER — Encounter: Payer: Self-pay | Admitting: Internal Medicine

## 2020-01-04 ENCOUNTER — Telehealth (INDEPENDENT_AMBULATORY_CARE_PROVIDER_SITE_OTHER): Payer: BLUE CROSS/BLUE SHIELD | Admitting: Internal Medicine

## 2020-01-04 VITALS — Ht 68.0 in | Wt 150.0 lb

## 2020-01-04 DIAGNOSIS — Z79899 Other long term (current) drug therapy: Secondary | ICD-10-CM

## 2020-01-04 DIAGNOSIS — Z20828 Contact with and (suspected) exposure to other viral communicable diseases: Secondary | ICD-10-CM | POA: Diagnosis not present

## 2020-01-04 DIAGNOSIS — F909 Attention-deficit hyperactivity disorder, unspecified type: Secondary | ICD-10-CM

## 2020-01-04 MED ORDER — METHYLPHENIDATE HCL ER (OSM) 27 MG PO TBCR: 27.0000 mg | EXTENDED_RELEASE_TABLET | ORAL | 0 refills | Status: AC

## 2020-01-04 NOTE — Progress Notes (Signed)
   Virtual Visit via Telephone Note  I connected with@ on 01/04/20 at 12:30 PM EDT by telephone and verified that I am speaking with the correct person using two identifiers.   I discussed the limitations, risks, security and privacy concerns of performing an evaluation and management service by telephone and the limited availability of in person appointments. tThere may be a patient responsible charge related to this service. The patient expressed understanding and agreed to proceed.  Location patient: homeschool Location provider: work office Participants present for the call: patient, provider Patient did not have a visit in the prior 7 days to address this/these issue(s).   History of Present Illness: Ann Cruz   Presents for [employer] visit   For med check  Taking now concerta 27  About 3 +days per week  Classes and on sun helps her get throught the  4 classes and no interference with sleep . Seems ok to help Changed from vyvanse cause of cost .  Senior  [employer]   neg td ocass weekend etoh ocass vape  Stopping  No rd  Sleep ok 8-9 hours  Mood ok  Is utd on pap smear was done dec 2020 On ocps ok  Iwas exposed and is on covid   isol qu for now  May be getting the vaccine ( mom had the disease and we discussed )  Observations/Objective: Patient sounds personable and well on the phone. I do not appreciate any SOB. Speech and thought processing are grossly intact. Patient reported vitals: Lab Results  Component Value Date   WBC 6.4 12/24/2016   HGB 15.1 12/24/2016   HCT 44.6 12/24/2016   PLT 286.0 12/24/2016   GLUCOSE 91 12/24/2016   CHOL 139 12/24/2016   TRIG 150.0 (H) 12/24/2016   HDL 55.40 12/24/2016   LDLCALC 53 12/24/2016   NA 136 12/24/2016   K 4.2 12/24/2016   CL 104 12/24/2016   CREATININE 0.97 12/24/2016   BUN 16 12/24/2016   CO2 28 12/24/2016    Assessment and Plan:   Follow Up Instructions: Plan  In person cpx preferred  No later than 6 mos    Benefit more than risk of medications  to continue. No sig se of med at this time    [id number] 5-10 [id number] 11-20 [id number] 21-30 I did not refer this patient for an OV in the next 24 hours for this/these issue(s).  I discussed the assessment and treatment plan with the patient. The patient was provided an opportunity to ask questions and answered. The patient agreed with the plan and demonstrated an understanding of the instructions.   The patient was advised to call back or seek an in-person evaluation if the symptoms worsen or if the condition fails to improve as anticipated.  I provided 11  minutes of non-face-to-face time during this encounter. Return for before 6 months  cpx and or med check .  Berniece Andreas, MD

## 2020-01-10 DIAGNOSIS — F429 Obsessive-compulsive disorder, unspecified: Secondary | ICD-10-CM | POA: Diagnosis not present

## 2020-01-10 DIAGNOSIS — F411 Generalized anxiety disorder: Secondary | ICD-10-CM | POA: Diagnosis not present

## 2020-01-31 DIAGNOSIS — F411 Generalized anxiety disorder: Secondary | ICD-10-CM | POA: Diagnosis not present

## 2020-01-31 DIAGNOSIS — F429 Obsessive-compulsive disorder, unspecified: Secondary | ICD-10-CM | POA: Diagnosis not present

## 2020-02-14 DIAGNOSIS — F411 Generalized anxiety disorder: Secondary | ICD-10-CM | POA: Diagnosis not present

## 2020-02-14 DIAGNOSIS — F429 Obsessive-compulsive disorder, unspecified: Secondary | ICD-10-CM | POA: Diagnosis not present

## 2020-02-28 DIAGNOSIS — F411 Generalized anxiety disorder: Secondary | ICD-10-CM | POA: Diagnosis not present

## 2020-02-28 DIAGNOSIS — F429 Obsessive-compulsive disorder, unspecified: Secondary | ICD-10-CM | POA: Diagnosis not present

## 2020-03-13 DIAGNOSIS — F411 Generalized anxiety disorder: Secondary | ICD-10-CM | POA: Diagnosis not present

## 2020-03-13 DIAGNOSIS — F429 Obsessive-compulsive disorder, unspecified: Secondary | ICD-10-CM | POA: Diagnosis not present

## 2020-04-17 DIAGNOSIS — F411 Generalized anxiety disorder: Secondary | ICD-10-CM | POA: Diagnosis not present

## 2020-04-17 DIAGNOSIS — F429 Obsessive-compulsive disorder, unspecified: Secondary | ICD-10-CM | POA: Diagnosis not present

## 2020-05-03 DIAGNOSIS — F429 Obsessive-compulsive disorder, unspecified: Secondary | ICD-10-CM | POA: Diagnosis not present

## 2020-05-03 DIAGNOSIS — F411 Generalized anxiety disorder: Secondary | ICD-10-CM | POA: Diagnosis not present

## 2020-05-21 DIAGNOSIS — Z6822 Body mass index (BMI) 22.0-22.9, adult: Secondary | ICD-10-CM | POA: Diagnosis not present

## 2020-05-21 DIAGNOSIS — Z01419 Encounter for gynecological examination (general) (routine) without abnormal findings: Secondary | ICD-10-CM | POA: Diagnosis not present

## 2020-05-21 DIAGNOSIS — Z113 Encounter for screening for infections with a predominantly sexual mode of transmission: Secondary | ICD-10-CM | POA: Diagnosis not present

## 2020-05-31 DIAGNOSIS — F429 Obsessive-compulsive disorder, unspecified: Secondary | ICD-10-CM | POA: Diagnosis not present

## 2020-05-31 DIAGNOSIS — F411 Generalized anxiety disorder: Secondary | ICD-10-CM | POA: Diagnosis not present

## 2020-06-18 DIAGNOSIS — F411 Generalized anxiety disorder: Secondary | ICD-10-CM | POA: Diagnosis not present

## 2020-06-18 DIAGNOSIS — F429 Obsessive-compulsive disorder, unspecified: Secondary | ICD-10-CM | POA: Diagnosis not present

## 2020-07-03 DIAGNOSIS — F429 Obsessive-compulsive disorder, unspecified: Secondary | ICD-10-CM | POA: Diagnosis not present

## 2020-07-03 DIAGNOSIS — F411 Generalized anxiety disorder: Secondary | ICD-10-CM | POA: Diagnosis not present

## 2020-07-13 DIAGNOSIS — D225 Melanocytic nevi of trunk: Secondary | ICD-10-CM | POA: Diagnosis not present

## 2020-07-13 DIAGNOSIS — B079 Viral wart, unspecified: Secondary | ICD-10-CM | POA: Diagnosis not present

## 2020-07-13 DIAGNOSIS — L739 Follicular disorder, unspecified: Secondary | ICD-10-CM | POA: Diagnosis not present

## 2020-07-13 DIAGNOSIS — L810 Postinflammatory hyperpigmentation: Secondary | ICD-10-CM | POA: Diagnosis not present

## 2020-07-31 DIAGNOSIS — F411 Generalized anxiety disorder: Secondary | ICD-10-CM | POA: Diagnosis not present

## 2020-07-31 DIAGNOSIS — F429 Obsessive-compulsive disorder, unspecified: Secondary | ICD-10-CM | POA: Diagnosis not present

## 2020-08-18 DIAGNOSIS — J020 Streptococcal pharyngitis: Secondary | ICD-10-CM | POA: Diagnosis not present

## 2020-08-18 DIAGNOSIS — Z20822 Contact with and (suspected) exposure to covid-19: Secondary | ICD-10-CM | POA: Diagnosis not present

## 2020-08-18 DIAGNOSIS — J029 Acute pharyngitis, unspecified: Secondary | ICD-10-CM | POA: Diagnosis not present

## 2020-08-20 DIAGNOSIS — F411 Generalized anxiety disorder: Secondary | ICD-10-CM | POA: Diagnosis not present

## 2020-08-20 DIAGNOSIS — F429 Obsessive-compulsive disorder, unspecified: Secondary | ICD-10-CM | POA: Diagnosis not present

## 2020-09-25 DIAGNOSIS — F411 Generalized anxiety disorder: Secondary | ICD-10-CM | POA: Diagnosis not present

## 2020-09-25 DIAGNOSIS — F429 Obsessive-compulsive disorder, unspecified: Secondary | ICD-10-CM | POA: Diagnosis not present

## 2021-05-08 DIAGNOSIS — M9901 Segmental and somatic dysfunction of cervical region: Secondary | ICD-10-CM | POA: Diagnosis not present

## 2021-05-08 DIAGNOSIS — M62838 Other muscle spasm: Secondary | ICD-10-CM | POA: Diagnosis not present

## 2021-05-08 DIAGNOSIS — M9902 Segmental and somatic dysfunction of thoracic region: Secondary | ICD-10-CM | POA: Diagnosis not present

## 2021-05-08 DIAGNOSIS — M9903 Segmental and somatic dysfunction of lumbar region: Secondary | ICD-10-CM | POA: Diagnosis not present

## 2021-05-08 DIAGNOSIS — M25511 Pain in right shoulder: Secondary | ICD-10-CM | POA: Diagnosis not present

## 2021-05-08 DIAGNOSIS — M25512 Pain in left shoulder: Secondary | ICD-10-CM | POA: Diagnosis not present
